# Patient Record
Sex: Female | Born: 1975 | Race: White | Hispanic: No | Marital: Married | Smoking: Never smoker
Health system: Southern US, Community
[De-identification: ages and names within clinical notes are randomized; demographics above are authoritative.]

## PROBLEM LIST (undated history)

## (undated) DIAGNOSIS — E119 Type 2 diabetes mellitus without complications: Secondary | ICD-10-CM

---

## 1999-04-22 ENCOUNTER — Other Ambulatory Visit: Admission: RE | Admit: 1999-04-22 | Discharge: 1999-04-22 | Payer: Self-pay | Admitting: Obstetrics and Gynecology

## 2000-06-13 ENCOUNTER — Other Ambulatory Visit: Admission: RE | Admit: 2000-06-13 | Discharge: 2000-06-13 | Payer: Self-pay | Admitting: Obstetrics and Gynecology

## 2001-06-26 ENCOUNTER — Other Ambulatory Visit: Admission: RE | Admit: 2001-06-26 | Discharge: 2001-06-26 | Payer: Self-pay | Admitting: Obstetrics and Gynecology

## 2002-08-06 ENCOUNTER — Other Ambulatory Visit: Admission: RE | Admit: 2002-08-06 | Discharge: 2002-08-06 | Payer: Self-pay | Admitting: Obstetrics and Gynecology

## 2002-12-25 ENCOUNTER — Other Ambulatory Visit (HOSPITAL_COMMUNITY): Admission: RE | Admit: 2002-12-25 | Discharge: 2002-12-31 | Payer: Self-pay | Admitting: Psychiatry

## 2003-03-25 ENCOUNTER — Other Ambulatory Visit: Admission: RE | Admit: 2003-03-25 | Discharge: 2003-03-25 | Payer: Self-pay | Admitting: Obstetrics and Gynecology

## 2003-08-12 ENCOUNTER — Encounter: Admission: RE | Admit: 2003-08-12 | Discharge: 2003-08-12 | Payer: Self-pay | Admitting: Obstetrics and Gynecology

## 2003-09-22 ENCOUNTER — Inpatient Hospital Stay (HOSPITAL_COMMUNITY): Admission: AD | Admit: 2003-09-22 | Discharge: 2003-09-22 | Payer: Self-pay | Admitting: Obstetrics and Gynecology

## 2003-10-11 ENCOUNTER — Inpatient Hospital Stay (HOSPITAL_COMMUNITY): Admission: RE | Admit: 2003-10-11 | Discharge: 2003-10-13 | Payer: Self-pay | Admitting: Obstetrics and Gynecology

## 2003-10-14 ENCOUNTER — Encounter: Admission: RE | Admit: 2003-10-14 | Discharge: 2003-11-13 | Payer: Self-pay | Admitting: Obstetrics and Gynecology

## 2003-11-26 ENCOUNTER — Other Ambulatory Visit: Admission: RE | Admit: 2003-11-26 | Discharge: 2003-11-26 | Payer: Self-pay | Admitting: Obstetrics and Gynecology

## 2004-12-08 ENCOUNTER — Other Ambulatory Visit: Admission: RE | Admit: 2004-12-08 | Discharge: 2004-12-08 | Payer: Self-pay | Admitting: Obstetrics and Gynecology

## 2005-11-22 ENCOUNTER — Inpatient Hospital Stay (HOSPITAL_COMMUNITY): Admission: RE | Admit: 2005-11-22 | Discharge: 2005-11-22 | Payer: Self-pay | Admitting: Obstetrics and Gynecology

## 2006-01-18 ENCOUNTER — Inpatient Hospital Stay (HOSPITAL_COMMUNITY): Admission: RE | Admit: 2006-01-18 | Discharge: 2006-01-20 | Payer: Self-pay | Admitting: Obstetrics and Gynecology

## 2006-01-21 ENCOUNTER — Encounter: Admission: RE | Admit: 2006-01-21 | Discharge: 2006-02-20 | Payer: Self-pay | Admitting: Obstetrics and Gynecology

## 2006-02-21 ENCOUNTER — Encounter: Admission: RE | Admit: 2006-02-21 | Discharge: 2006-03-21 | Payer: Self-pay | Admitting: Obstetrics and Gynecology

## 2006-03-22 ENCOUNTER — Encounter: Admission: RE | Admit: 2006-03-22 | Discharge: 2006-04-21 | Payer: Self-pay | Admitting: Obstetrics and Gynecology

## 2006-04-22 ENCOUNTER — Encounter: Admission: RE | Admit: 2006-04-22 | Discharge: 2006-05-21 | Payer: Self-pay | Admitting: Obstetrics and Gynecology

## 2006-05-22 ENCOUNTER — Encounter: Admission: RE | Admit: 2006-05-22 | Discharge: 2006-06-21 | Payer: Self-pay | Admitting: Obstetrics and Gynecology

## 2006-06-22 ENCOUNTER — Encounter: Admission: RE | Admit: 2006-06-22 | Discharge: 2006-07-20 | Payer: Self-pay | Admitting: Obstetrics and Gynecology

## 2008-08-12 ENCOUNTER — Encounter: Admission: RE | Admit: 2008-08-12 | Discharge: 2008-11-10 | Payer: Self-pay | Admitting: Obstetrics and Gynecology

## 2008-12-12 ENCOUNTER — Inpatient Hospital Stay (HOSPITAL_COMMUNITY): Admission: AD | Admit: 2008-12-12 | Discharge: 2008-12-12 | Payer: Self-pay | Admitting: Specialist

## 2008-12-12 ENCOUNTER — Ambulatory Visit: Payer: Self-pay | Admitting: Obstetrics and Gynecology

## 2008-12-19 ENCOUNTER — Observation Stay (HOSPITAL_COMMUNITY): Admission: AD | Admit: 2008-12-19 | Discharge: 2008-12-19 | Payer: Self-pay | Admitting: Obstetrics and Gynecology

## 2008-12-30 ENCOUNTER — Observation Stay (HOSPITAL_COMMUNITY): Admission: AD | Admit: 2008-12-30 | Discharge: 2008-12-31 | Payer: Self-pay | Admitting: Obstetrics and Gynecology

## 2008-12-31 ENCOUNTER — Encounter: Payer: Self-pay | Admitting: Obstetrics and Gynecology

## 2009-01-05 ENCOUNTER — Encounter (INDEPENDENT_AMBULATORY_CARE_PROVIDER_SITE_OTHER): Payer: Self-pay | Admitting: Obstetrics and Gynecology

## 2009-01-05 ENCOUNTER — Inpatient Hospital Stay (HOSPITAL_COMMUNITY): Admission: AD | Admit: 2009-01-05 | Discharge: 2009-01-08 | Payer: Self-pay | Admitting: Obstetrics and Gynecology

## 2009-01-10 ENCOUNTER — Encounter: Admission: RE | Admit: 2009-01-10 | Discharge: 2009-02-09 | Payer: Self-pay | Admitting: Obstetrics and Gynecology

## 2009-02-10 ENCOUNTER — Encounter: Admission: RE | Admit: 2009-02-10 | Discharge: 2009-03-09 | Payer: Self-pay | Admitting: Obstetrics and Gynecology

## 2009-03-10 ENCOUNTER — Encounter: Admission: RE | Admit: 2009-03-10 | Discharge: 2009-04-09 | Payer: Self-pay | Admitting: Obstetrics and Gynecology

## 2009-04-10 ENCOUNTER — Encounter: Admission: RE | Admit: 2009-04-10 | Discharge: 2009-05-10 | Payer: Self-pay | Admitting: Obstetrics and Gynecology

## 2009-05-11 ENCOUNTER — Encounter: Admission: RE | Admit: 2009-05-11 | Discharge: 2009-06-10 | Payer: Self-pay | Admitting: Obstetrics and Gynecology

## 2009-06-11 ENCOUNTER — Encounter: Admission: RE | Admit: 2009-06-11 | Discharge: 2009-07-09 | Payer: Self-pay | Admitting: Obstetrics and Gynecology

## 2009-07-10 ENCOUNTER — Encounter: Admission: RE | Admit: 2009-07-10 | Discharge: 2009-07-22 | Payer: Self-pay | Admitting: Obstetrics and Gynecology

## 2010-04-12 LAB — CBC
HCT: 32.2 % — ABNORMAL LOW (ref 36.0–46.0)
HCT: 34.1 % — ABNORMAL LOW (ref 36.0–46.0)
Hemoglobin: 11.4 g/dL — ABNORMAL LOW (ref 12.0–15.0)
MCHC: 34.6 g/dL (ref 30.0–36.0)
MCHC: 35.3 g/dL (ref 30.0–36.0)
MCV: 91.4 fL (ref 78.0–100.0)
MCV: 92.4 fL (ref 78.0–100.0)
RBC: 3.52 MIL/uL — ABNORMAL LOW (ref 3.87–5.11)
RBC: 3.69 MIL/uL — ABNORMAL LOW (ref 3.87–5.11)
WBC: 8.7 10*3/uL (ref 4.0–10.5)
WBC: 9.4 10*3/uL (ref 4.0–10.5)

## 2010-05-28 NOTE — Discharge Summary (Signed)
[name], [name] NO.:  [id number]   MEDICAL RECORD NO.:  [id number]          PATIENT TYPE:  INP   LOCATION:  9147                          FACILITY:  [facility]   PHYSICIAN:  Zelphia Cairo, MD    DATE OF BIRTH:  April 27, 1975   DATE OF ADMISSION:  01/18/2006  DATE OF DISCHARGE:  01/20/2006                               DISCHARGE SUMMARY   ADMITTING DIAGNOSES:  1. Intrauterine pregnancy at term.  2. Previous cesarean section, desires repeat.  3. Gestational diabetes, managed by Glyburide.  4. Spontaneous onset of labor.   DISCHARGE DIAGNOSIS:  Status post low transverse cesarean section to a  viable female infant.   PROCEDURE:  Repeat low transverse cesarean section.   REASON FOR ADMISSION:  Please see written H and P.   HOSPITAL COURSE:  The patient is a 35 year old gravida 2, para 1, who  was admitted to [facility] for a scheduled cesarean  section.  The patient also had started spontaneous onset of labor.  The  patient's pregnancy had been complicated by gestational diabetes which  had been managed by Glyburide.  Sugars have been under good control.  On  the morning of admission, the patient was taken to the operating room  where spinal anesthesia was administered without difficulty.  A low  transverse incision was made with delivery of a viable female infant,  weighing 6 pounds 14 ounces, Apgars at 8 at 1 minute and 9 at 5 minutes.  Arterial cord pH of 7.32.  The patient tolerated procedure and was taken  to the recovery room in a stable condition.  On postoperative day #1,  the patient was without complaint.  Vital signs were stable.  Abdomen  was soft with good return of bowel function.  Fundus was firm and  nontender.  Abdominal dressing noted to be clean, dry, and intact.  Foley had been discontinued.  The patient was voiding adequately.  Laboratory findings revealed a hemoglobin of  9.9, platelet count of  200,000,  WBC count of 9.3.   Fasting blood sugar and 2-hour  postprandial's were ordered for the following morning.  On postoperative  day #2, the patient was without complaint.  Vital signs were stable.  Capillary blood sugar was 120 that morning; however, the patient had not  fasted.  Abdomen was soft.  Fundus was firm and nontender.  Incision was  clean, dry, and intact.  The patient did desire early discharge.  Instructions were reviewed and the patient was later discharged home.   CONDITION ON DISCHARGE:  Good.   DIET:  Regular as tolerated.   ACTIVITY:  No heavy lifting, no driving x2 weeks, or vaginal entry.   FOLLOWUP:  The patient to follow up in the office in 1 week for an  incision check.  She is to call for a temperature greater than 100  degrees, persistent nausea, vomiting, heavy vaginal bleeding, and/or  redness or drainage from the incisional site.   DISCHARGE MEDICATIONS:  1. Zoloft 50 mg one p.o. daily.  2. Wellbutrin 150 mg daily.  3. Percocet 5/325 #30, 1  p.o. q. 4 to 6 hours p.r.n.  4. Motrin 600 mg every 6 hours.  5. Prenatal vitamins one p.o. daily.      Julio Sicks, N.P.      Zelphia Cairo, MD  Electronically Signed    CC/MEDQ  D:  02/01/2006  T:  02/01/2006  Job:  [id number]

## 2010-05-28 NOTE — H&P (Signed)
NAME:  Cathy [name], Cathy [name] NO.:  [id number]   MEDICAL RECORD NO.:  [id number]          PATIENT TYPE:  INP   LOCATION:  NA                            [facility]:  [facility]   PHYSICIAN:  Dineen Kid. Rana Snare, M.D.    DATE OF BIRTH:  [birth year]   DATE OF ADMISSION:  10/11/2003  DATE OF DISCHARGE:                                HISTORY & PHYSICAL   HISTORY OF PRESENT ILLNESS:  Ms. Pancake is a 35 year old, G2, P0, A1, at [redacted]  weeks gestational age, who presents for primary low __________ transverse  cesarean section due to breech presentation.  Her estimated date of  confinement is October 20, 2003.  The pregnancy has been complicated by  labile blood pressures, and also gestational diabetes, which has been  controlled with glyburide, and group B strep is negative.   PAST MEDICAL HISTORY:  1.  Significant for history of depression,  2.  Anxiety.  3.  Migraines.   PAST SURGICAL HISTORY:  TAB.   MEDICATIONS:  1.  Prenatal vitamins.  2.  Glyburide 5 mg daily.   ALLERGIES:  No known drug allergies.   PHYSICAL EXAMINATION:  VITAL SIGNS:  Blood pressure is 126/70.  HEART:  Regular rate and rhythm.  LUNGS:  Clear to auscultation bilaterally.  ABDOMEN:  Gravid, 38 cm.  PELVIC:  The cervix is fingertip, long.  Breech presentation by sonogram.   IMPRESSION:  1.  Intrauterine pregnancy at 39 weeks.  2.  Breech presentation.  3.  Gestational diabetes controlled by glyburide.   PLAN:  Primary low __________ cesarean section.  Risks and benefits were  discussed.  Informed consent was obtained.      DCL/MEDQ  D:  10/10/2003  T:  10/10/2003  Job:  [id number]

## 2010-05-28 NOTE — Op Note (Signed)
NAME:  Cathy [name], Cathy [name] NO.:  [id number]   MEDICAL RECORD NO.:  [id number]          PATIENT TYPE:  INP   LOCATION:  9147                          [facility]  [facility]   PHYSICIAN:  Dineen Kid. Rana Snare, M.D.    DATE OF BIRTH:  [birth year]   DATE OF PROCEDURE:  01/18/2006  DATE OF DISCHARGE:                               OPERATIVE REPORT   PREOPERATIVE DIAGNOSES:  1. Previous cesarean section with desire of repeat.  2. Intrauterine pregnancy at 37 weeks.  3. Gestational diabetes.  4. Labor.   POSTOPERATIVE DIAGNOSES:  1. Previous cesarean section with desire of repeat.  2. Intrauterine pregnancy at 37 weeks.  3. Gestational diabetes.  4. Labor.   PROCEDURE:  Repeat low segment transverse cesarean section.   SURGEON:  Dineen Kid. Rana Snare, M.D.   ANESTHESIA:  Spinal.   INDICATIONS FOR PROCEDURE:  Ms. Cornell is a 35 year old G3, P1, previous  cesarean section for breech, has desire of repeat cesarean section.  She  has had a pregnancy complicated by gestational diabetes requiring  glyburide for glucose control.  Underwent attempted amniocentesis  yesterday, unable to complete due to fetal position changes and also  contractions.  Patient is contracting every 5-10 minutes.  Because of  early labor, it was decided to proceed with repeat cesarean section.  Risks and benefits were discussed and informed consent was obtained.   FINDINGS:  Viable female infant, Apgars 8 and 9, pH arterial 7.32.   DESCRIPTION OF PROCEDURE:  After adequate analgesia, the patient placed  in the supine position with left lateral tilt.  She is sterilely prepped  and draped, bladder is sterilely drained with Foley catheter.  Pfannenstiel skin incision was made two fingerbreadths above the pubic  symphysis taken down sharply to the fascia which is incised  transversely, extended superiorly and inferiorly off the bellies of  rectus muscle which is separated sharply in midline.  Peritoneum entered  sharply.   Bladder flap created and placed behind the bladder blade.  Low  segment myotomy incision was made down to the infant's vertex and  extended laterally with the operator's fingertips.  Infant's vertex was  delivered atraumatically.  The nares and pharynx suctioned.  Nuchal cord  x2 was reduced.  The infant was delivered, cord clamped, cut and handed  to the pediatrician for resuscitation.  Cord blood was obtained.  Placenta extracted manually.  Uterus was exteriorized, wiped clean with  dry lap.  The __________ incision was closed in two layers, first being  running locking and the second being imbricating layer with 0 Monocryl  suture.  Uterus was placed back in the peritoneal cavity and after  copious amount of irrigation, adequate hemostasis was assured.  The  peritoneum was closed with 0 Monocryl suture.  Rectus muscle plicated in  the midline.  Irrigation was applied and after adequate hemostasis, the  fascia was closed with #1 PDS in running fashion.  Irrigation was once  again applied and after adequate hemostasis, skin staples and Steri-  Strips applied.  The patient tolerated the procedure well.  She was  stable on  transfer to the recovery room.  Sponge and instrument counts  correct x3.   ESTIMATED BLOOD LOSS:  700 mL.   Patient received 1 g of Rocephin after delivery of the placenta.      Dineen Kid Rana Snare, M.D.  Electronically Signed     DCL/MEDQ  D:  01/18/2006  T:  01/18/2006  Job:  [id number]

## 2011-03-31 IMAGING — US US FETAL BPP W/O NONSTRESS
1 series · 14 of 28 positions shown · non-contrast
Comparison: none

OBSTETRICAL ULTRASOUND:
 This ultrasound exam was performed in the [HOSPITAL] Ultrasound Department.  The OB US report was generated in the AS system, and faxed to the ordering physician.  This report is also available in [HOSPITAL]?s AccessANYware and in [REDACTED] PACS.

[Series 1: us ob comp +14 wk · 52 acquisitions, 14 frames shown]
[im 2/52]
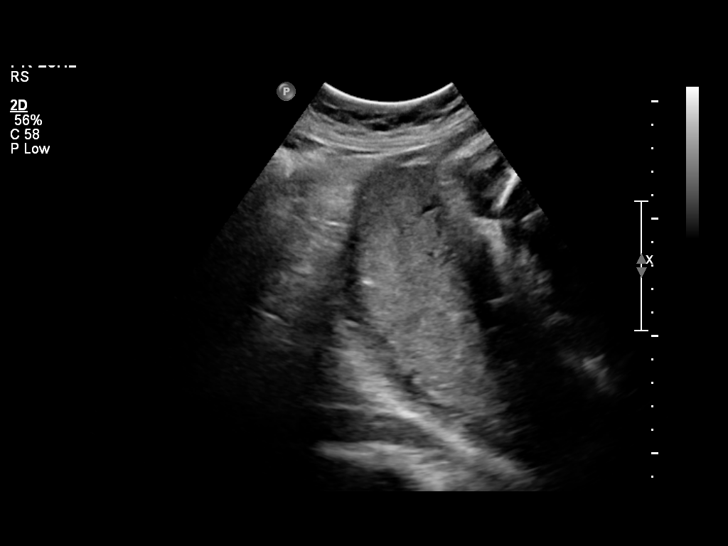
[im 6/52]
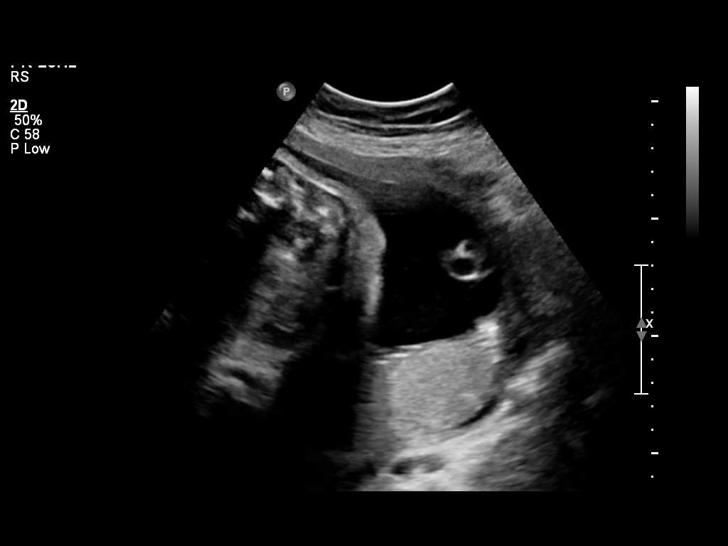
[im 10/52]
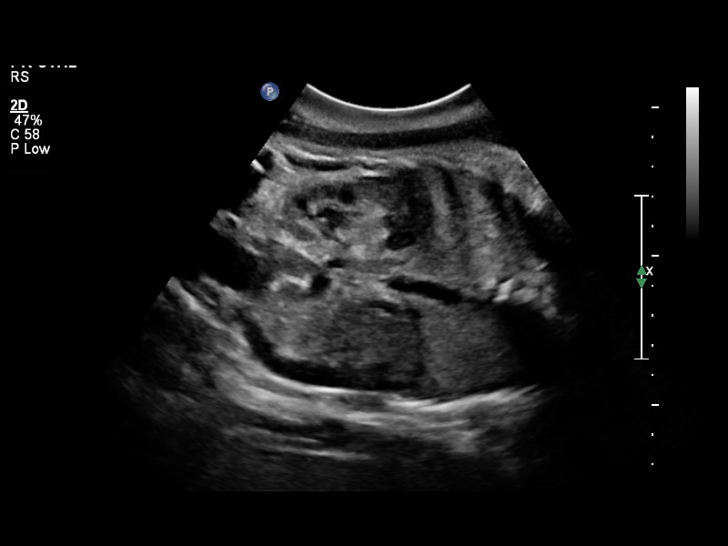
[im 14/52]
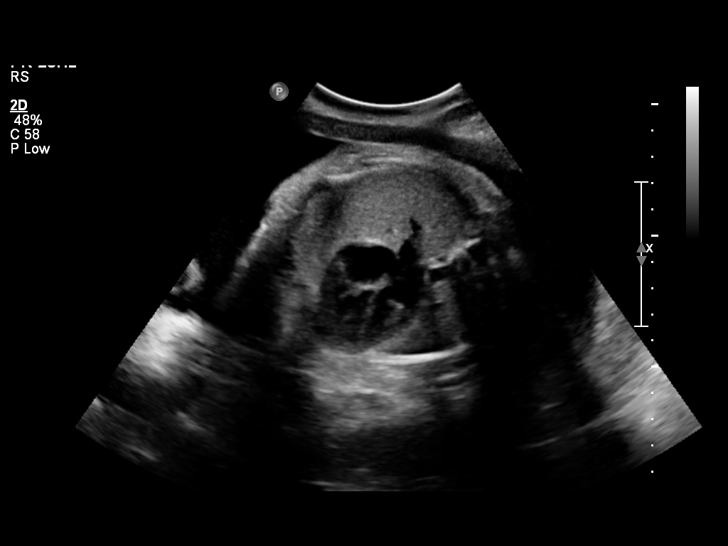
[im 18/52]
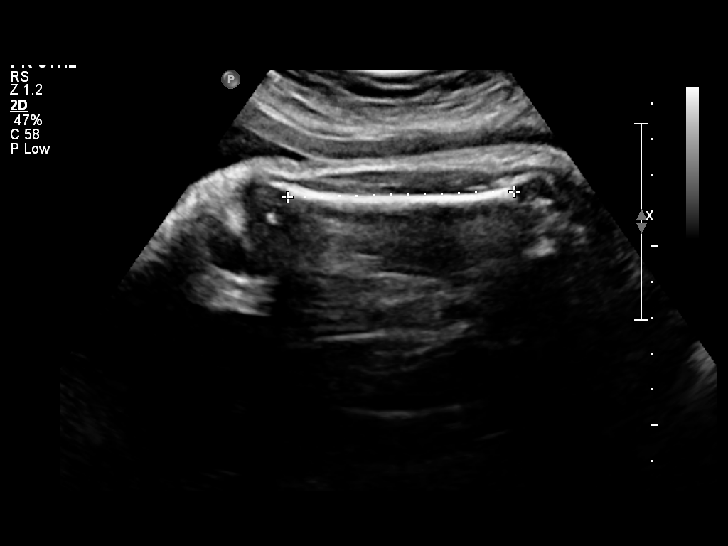
[im 21/52]
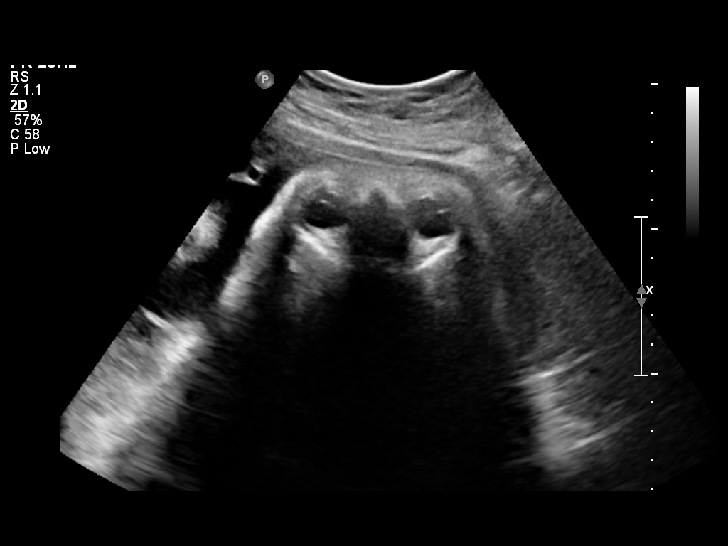
[im 25/52]
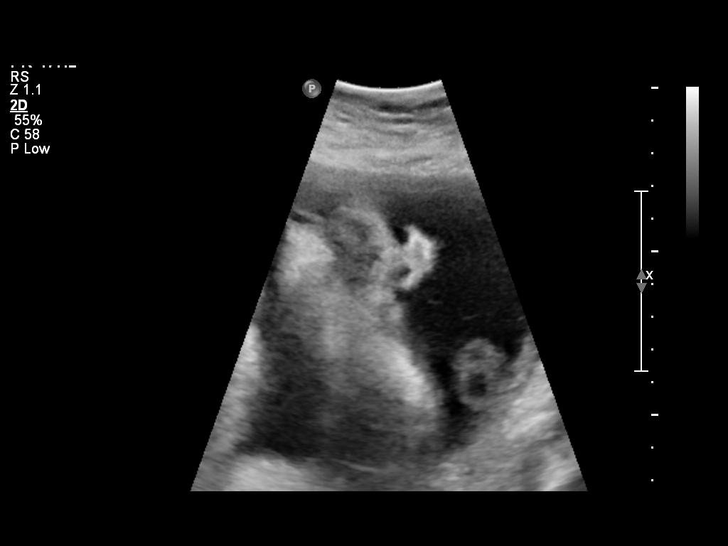
[im 29/52]
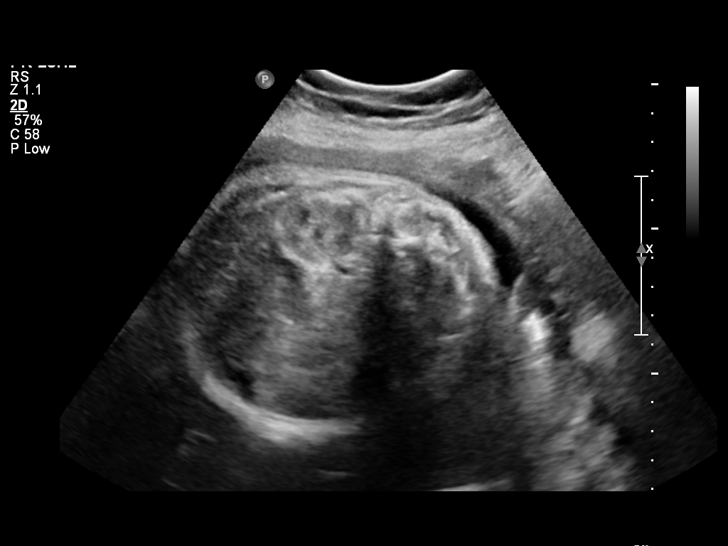
[im 33/52]
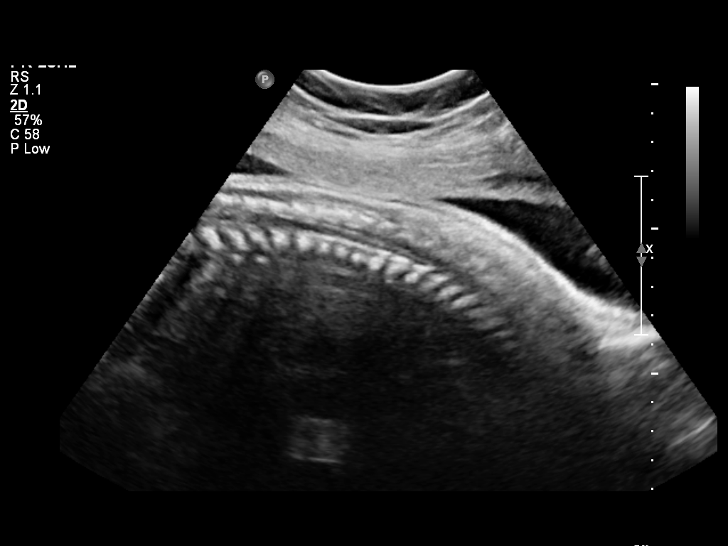
[im 36/52]
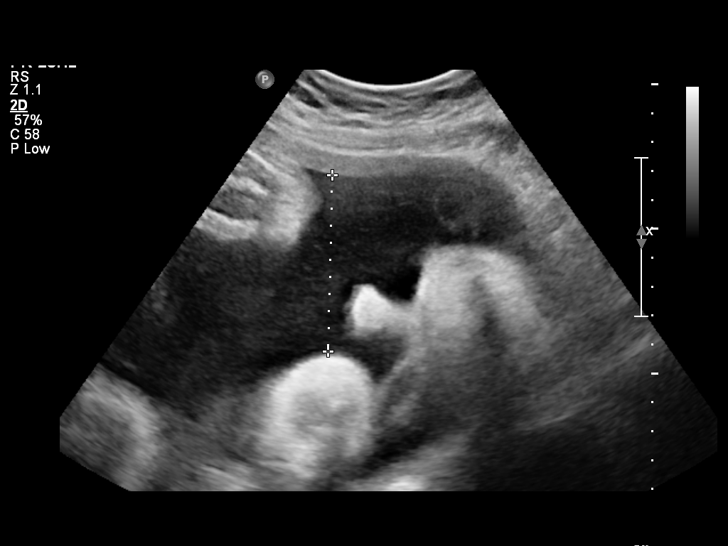
[im 40/52]
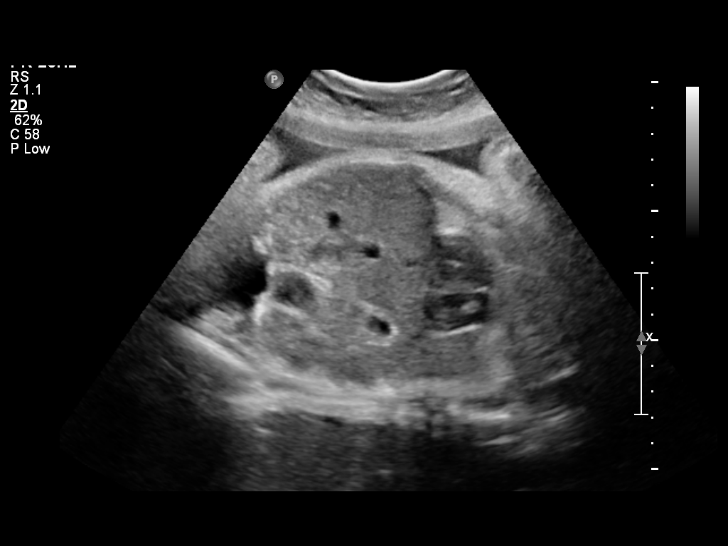
[im 44/52]
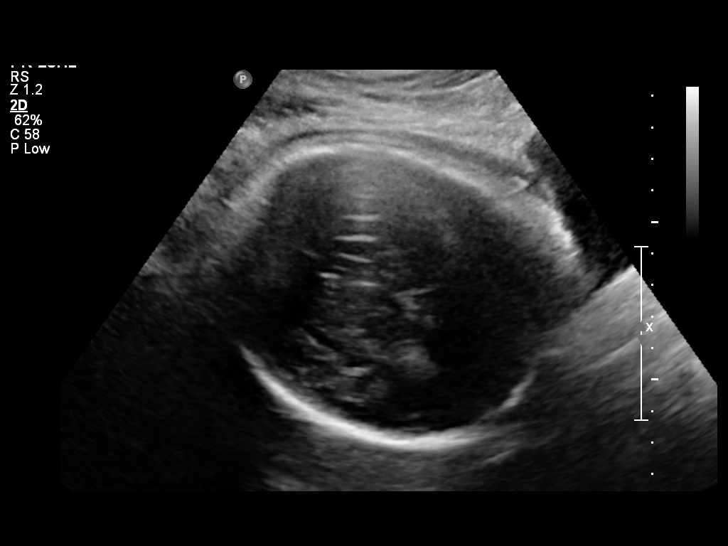
[im 48/52]
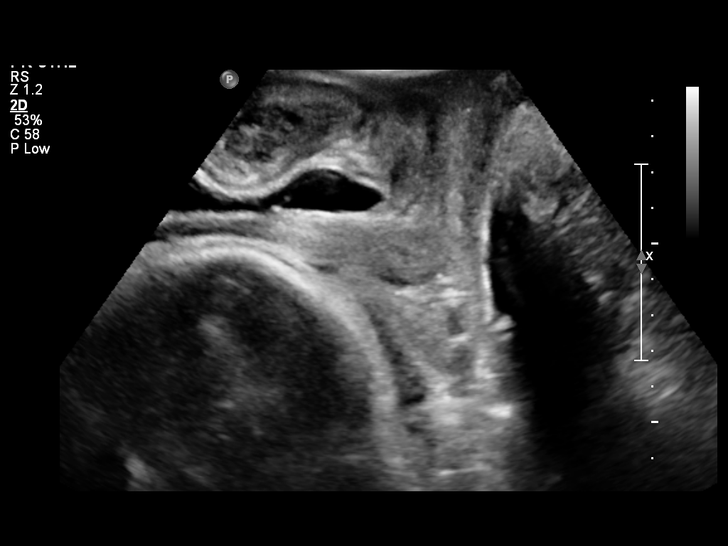
[im 52/52]
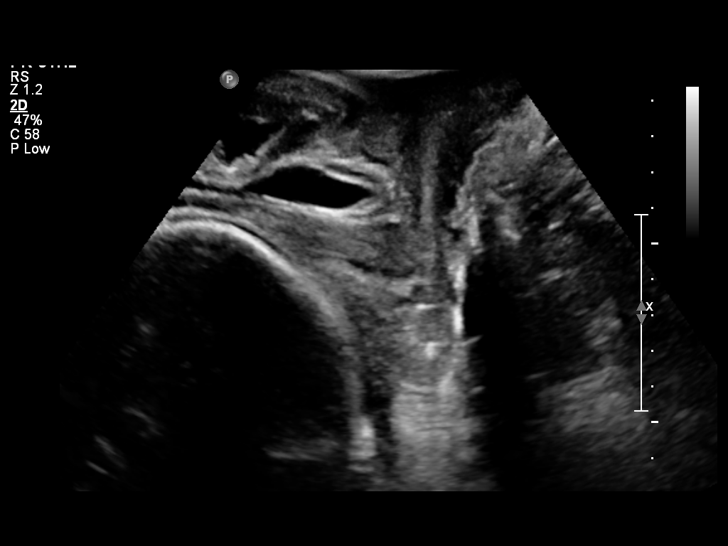

[14 of 28 positions shown; findings below may reference images not displayed]

IMPRESSION: See AS Obstetric US report.

## 2011-04-12 IMAGING — US US OB UMBILICAL ART DOPPLER
1 series · 14 of 28 positions shown · non-contrast
Comparison: none

OBSTETRICAL ULTRASOUND:
 This ultrasound was performed in The [HOSPITAL], and the AS OB/GYN report will be stored to [REDACTED] PACS.  This report is also available in [HOSPITAL]?s accessANYware.

[Series 1: us ob umbilical art doppler · 0.25mm/px · 33 acquisitions, 14 frames shown]
[im 2/33]
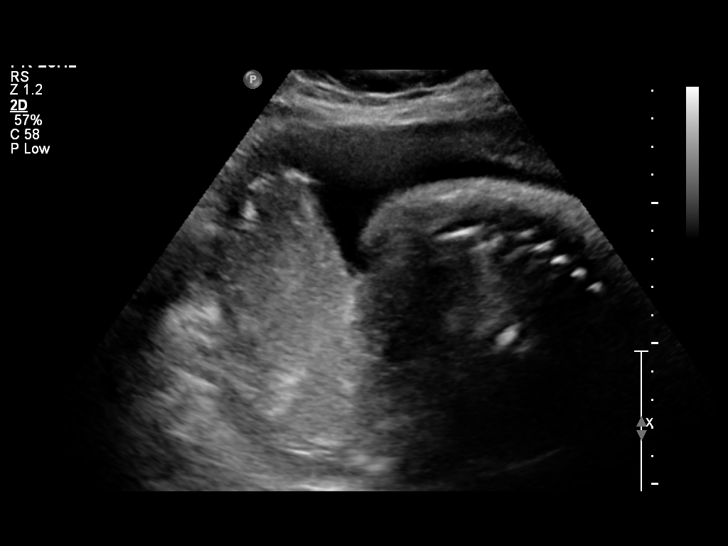
[im 4/33]
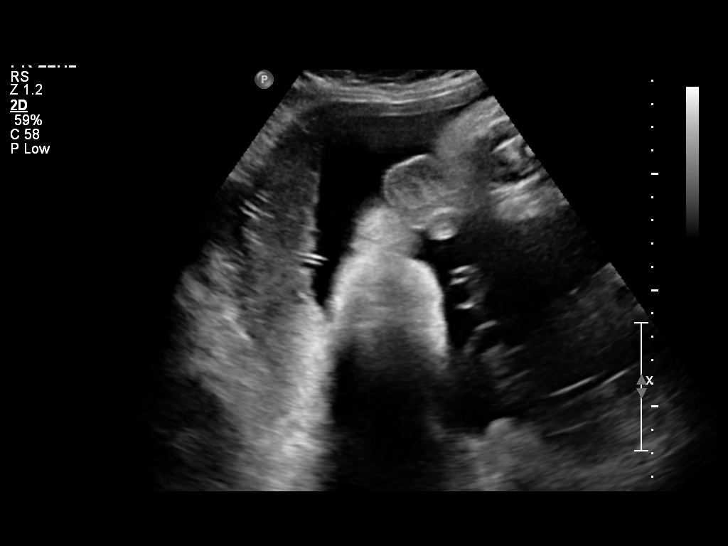
[im 6/33]
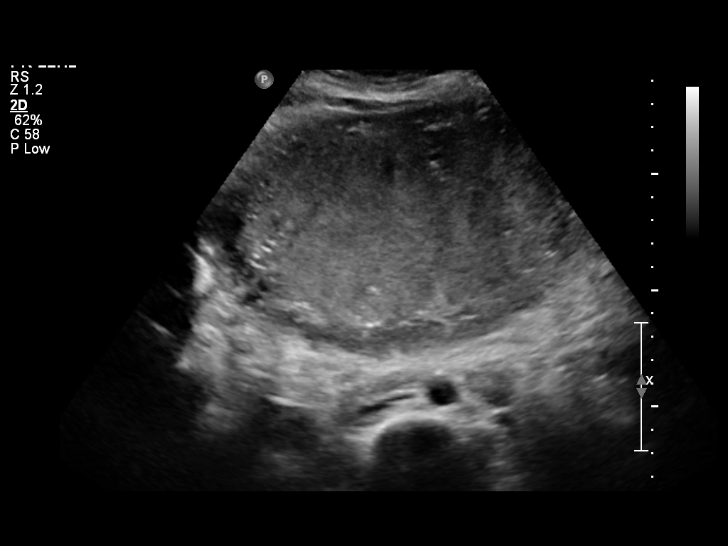
[im 9/33]
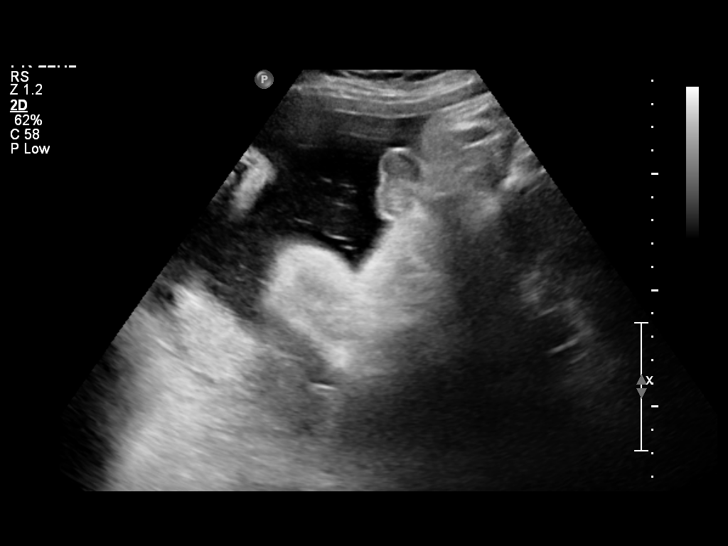
[im 11/33]
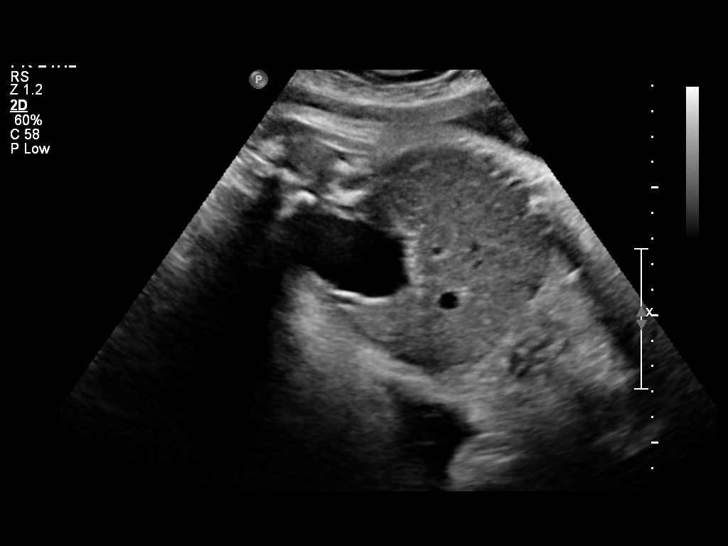
[im 14/33]
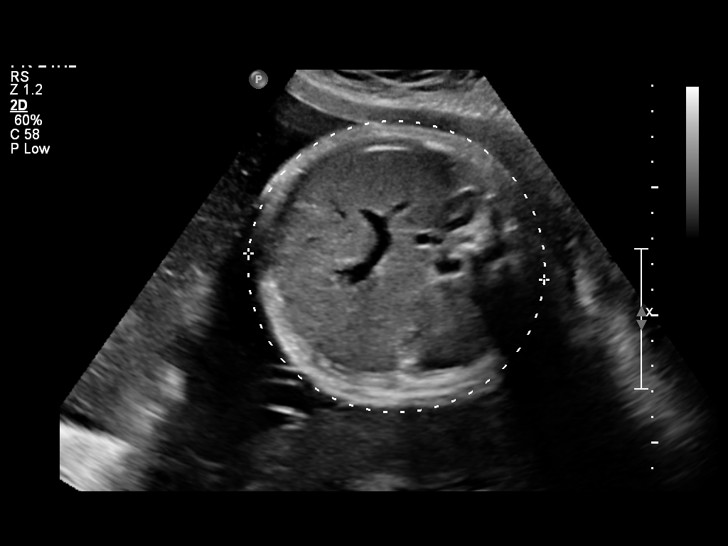
[im 16/33]
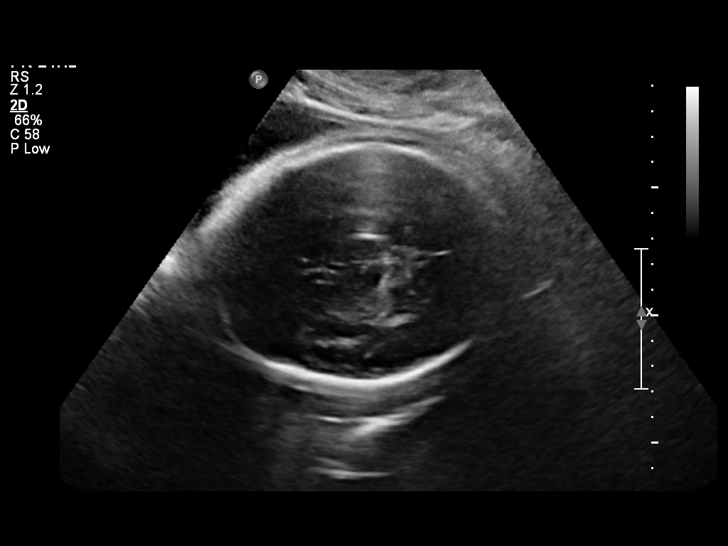
[im 18/33]
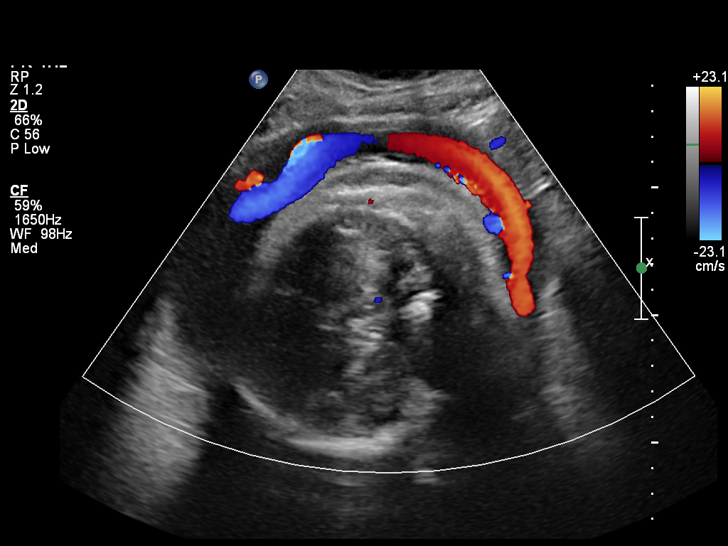
[im 21/33]
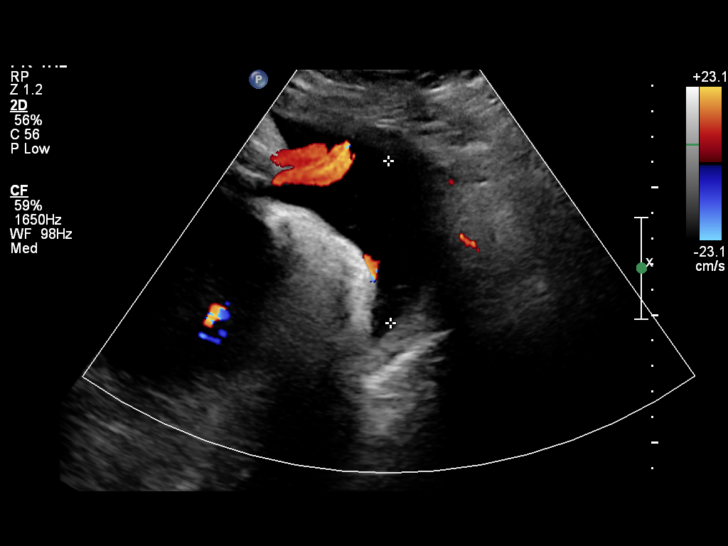
[im 23/33]
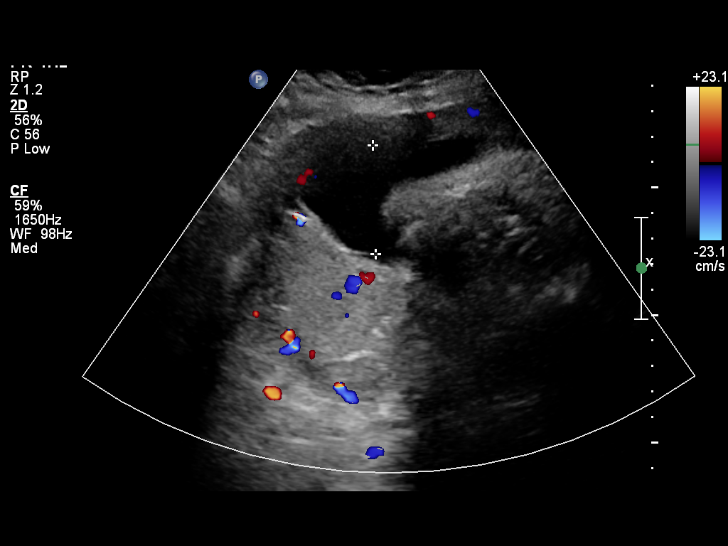
[im 25/33]
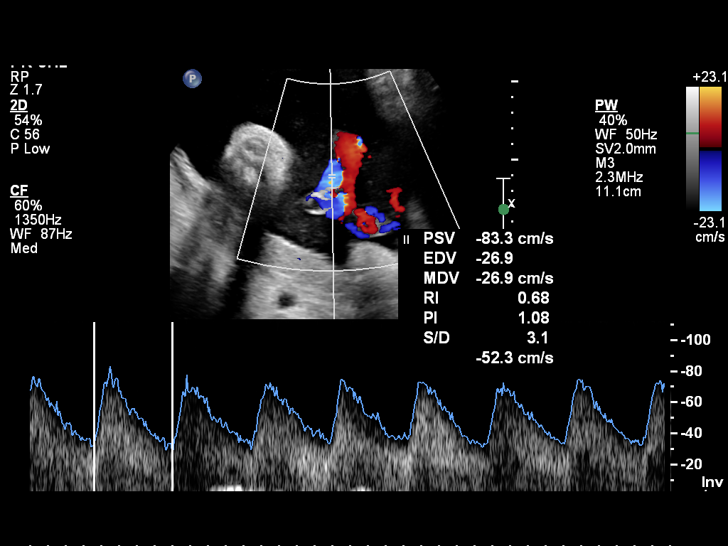
[im 28/33]
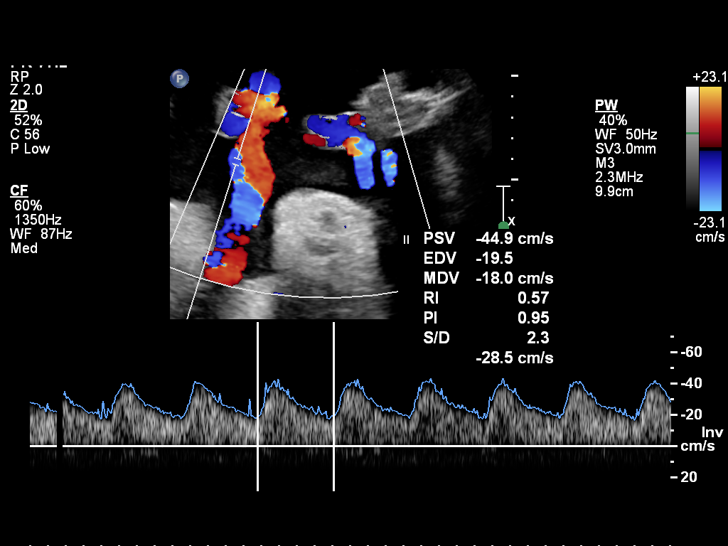
[im 30/33]
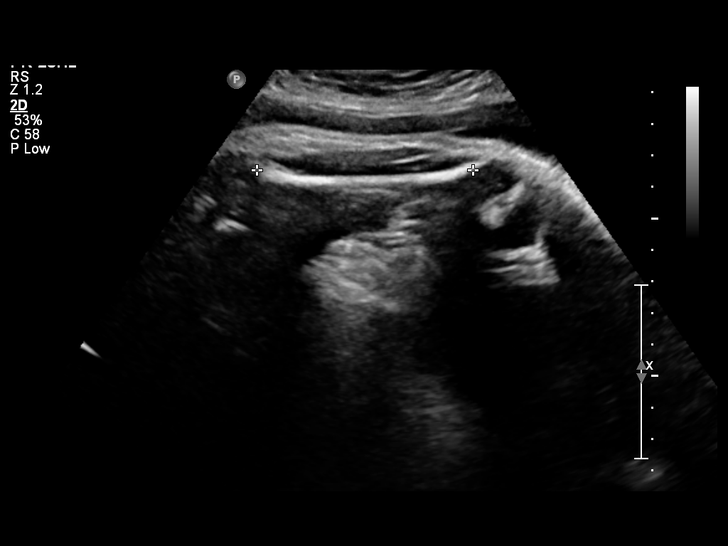
[im 33/33]
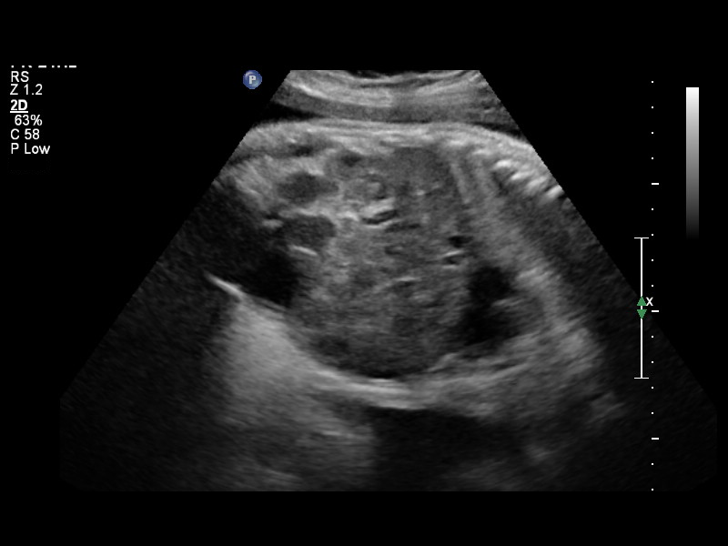

[14 of 28 positions shown; findings below may reference images not displayed]

IMPRESSION: AS OB/GYN has also been faxed to the ordering physician.

## 2014-05-22 ENCOUNTER — Other Ambulatory Visit: Payer: Self-pay | Admitting: Obstetrics and Gynecology

## 2014-05-23 LAB — CYTOLOGY - PAP

## 2019-03-16 ENCOUNTER — Ambulatory Visit: Payer: Self-pay | Attending: Internal Medicine

## 2019-03-16 DIAGNOSIS — Z23 Encounter for immunization: Secondary | ICD-10-CM | POA: Insufficient documentation

## 2019-03-16 NOTE — Progress Notes (Signed)
   Covid-19 Vaccination Clinic  Name:  Cathy [name]    MRN: [id number] DOB: 1975-12-17  03/16/2019  [name] was observed post Covid-19 immunization for 15 minutes without incident. She was provided with Vaccine Information Sheet and instruction to access the V-Safe system.   [name] was instructed to call 911 with any severe reactions post vaccine: Marland Kitchen Difficulty breathing  . Swelling of face and throat  . A fast heartbeat  . A bad rash all over body  . Dizziness and weakness   Immunizations Administered    Name Date Dose VIS Date Route   Pfizer COVID-19 Vaccine 03/16/2019  9:19 AM 0.3 mL 12/21/2018 Intramuscular   Manufacturer: [employer], [employer]   Lot: [id number]   NDC: [address]-0

## 2019-04-06 ENCOUNTER — Ambulatory Visit: Payer: Self-pay | Attending: Internal Medicine

## 2019-04-06 DIAGNOSIS — Z23 Encounter for immunization: Secondary | ICD-10-CM

## 2019-04-06 NOTE — Progress Notes (Signed)
   Covid-19 Vaccination Clinic  Name:  Cathy [name]    MRN: [id number] DOB: 05-06-1975  04/06/2019  [name] was observed post Covid-19 immunization for 15 minutes without incident. She was provided with Vaccine Information Sheet and instruction to access the V-Safe system.   [name] was instructed to call 911 with any severe reactions post vaccine: Marland Kitchen Difficulty breathing  . Swelling of face and throat  . A fast heartbeat  . A bad rash all over body  . Dizziness and weakness   Immunizations Administered    Name Date Dose VIS Date Route   Pfizer COVID-19 Vaccine 04/06/2019  8:16 AM 0.3 mL 12/21/2018 Intramuscular   Manufacturer: [employer], [employer]   Lot: [id number]   NDC: [address]-2

## 2020-08-21 ENCOUNTER — Emergency Department (HOSPITAL_BASED_OUTPATIENT_CLINIC_OR_DEPARTMENT_OTHER)
Admission: EM | Admit: 2020-08-21 | Discharge: 2020-08-21 | Disposition: A | Discharge status: Home / Self Care | Payer: BC Managed Care – PPO | Attending: Emergency Medicine | Admitting: Emergency Medicine

## 2020-08-21 ENCOUNTER — Encounter (HOSPITAL_BASED_OUTPATIENT_CLINIC_OR_DEPARTMENT_OTHER): Payer: Self-pay | Admitting: Emergency Medicine

## 2020-08-21 ENCOUNTER — Other Ambulatory Visit: Payer: Self-pay

## 2020-08-21 DIAGNOSIS — S50812A Abrasion of left forearm, initial encounter: Secondary | ICD-10-CM | POA: Diagnosis not present

## 2020-08-21 DIAGNOSIS — S61251A Open bite of left index finger without damage to nail, initial encounter: Secondary | ICD-10-CM | POA: Diagnosis not present

## 2020-08-21 DIAGNOSIS — E119 Type 2 diabetes mellitus without complications: Secondary | ICD-10-CM | POA: Diagnosis not present

## 2020-08-21 DIAGNOSIS — S50811A Abrasion of right forearm, initial encounter: Secondary | ICD-10-CM | POA: Insufficient documentation

## 2020-08-21 DIAGNOSIS — S61452A Open bite of left hand, initial encounter: Secondary | ICD-10-CM

## 2020-08-21 DIAGNOSIS — S60512A Abrasion of left hand, initial encounter: Secondary | ICD-10-CM | POA: Diagnosis not present

## 2020-08-21 DIAGNOSIS — W5501XA Bitten by cat, initial encounter: Secondary | ICD-10-CM | POA: Insufficient documentation

## 2020-08-21 DIAGNOSIS — S6991XA Unspecified injury of right wrist, hand and finger(s), initial encounter: Secondary | ICD-10-CM | POA: Diagnosis present

## 2020-08-21 DIAGNOSIS — S60511A Abrasion of right hand, initial encounter: Secondary | ICD-10-CM | POA: Insufficient documentation

## 2020-08-21 HISTORY — DX: Type 2 diabetes mellitus without complications: E11.9

## 2020-08-21 NOTE — ED Triage Notes (Addendum)
Pt arrives to ED with c/o of cat bite. Cat bite and scratches to both hands. Pt was seen at [facility] yesterday and was prescribed with Augmentin, hand a n xray, and received her Tdap.

## 2020-08-21 NOTE — ED Notes (Signed)
Patient verbalizes understanding of discharge instructions. Opportunity for questioning and answers were provided. Patient discharged from ED.  °

## 2020-08-21 NOTE — ED Provider Notes (Addendum)
[facility] EMERGENCY DEPT Provider Note   CSN: [id number] Arrival date & time: 08/21/20  7124     History Chief Complaint  Patient presents with   Animal Bite    Cathy [name] is a 45 y.o. female.  Patient yesterday morning received cat bite and scratches to hands and arms.  Patient seen at [facility] urgent care yesterday.  Started on Augmentin.  Had 2 doses yesterday.  Had an x-ray of her left hand without evidence of any foreign body or fracture.  Patient also was updated on her tetanus.  Patient here today because she is got some increased swelling to her left index finger.  She had some yesterday but its worse and some increased pain with flexion no pain with extension.  Patient's been followed by [facility] in the past.  In addition according to the urgent care note.  Mother cat is vaccinated.  But the kittens obviously have not been vaccinated yet they are too young.  Patient did not want rabies prophylaxis.  The cats can be observed.      Past Medical History:  Diagnosis Date   Diabetes mellitus without complication (HCC)     There are no problems to display for this patient.   History reviewed. No pertinent surgical history.   OB History   No obstetric history on file.     History reviewed. No pertinent family history.  Social History   Tobacco Use   Smoking status: Never   Smokeless tobacco: Never    Home Medications Prior to Admission medications   Not on File    Allergies    Morphine  Review of Systems   Review of Systems  Constitutional:  Negative for chills and fever.  HENT:  Negative for ear pain and sore throat.   Eyes:  Negative for pain and visual disturbance.  Respiratory:  Negative for cough and shortness of breath.   Cardiovascular:  Negative for chest pain and palpitations.  Gastrointestinal:  Negative for abdominal pain and vomiting.  Genitourinary:  Negative for dysuria and hematuria.  Musculoskeletal:   Positive for joint swelling. Negative for arthralgias and back pain.  Skin:  Positive for wound. Negative for color change and rash.  Neurological:  Negative for seizures and syncope.  All other systems reviewed and are negative.  Physical Exam Updated Vital Signs BP (!) 139/93 (BP Location: Left Arm)   Pulse 88   Temp 98.3 F (36.8 C) (Oral)   Resp 16   Ht 1.626 m (5\' 4" )   Wt 108.9 kg   SpO2 98%   BMI 41.20 kg/m   Physical Exam Vitals and nursing note reviewed.  Constitutional:      General: She is not in acute distress.    Appearance: Normal appearance. She is well-developed.  HENT:     Head: Normocephalic and atraumatic.  Eyes:     Conjunctiva/sclera: Conjunctivae normal.  Cardiovascular:     Rate and Rhythm: Normal rate and regular rhythm.     Heart sounds: No murmur heard. Pulmonary:     Effort: Pulmonary effort is normal. No respiratory distress.     Breath sounds: Normal breath sounds.  Abdominal:     Palpations: Abdomen is soft.     Tenderness: There is no abdominal tenderness.  Musculoskeletal:        General: Swelling and signs of injury present.     Cervical back: Normal range of motion and neck supple.     Comments: Patient with multiple  scratches to both hands forearms.  Cat bite to left index finger over the PIP joint.  Some swelling to the proximal phalange E.  Good extension reasonable flexion without significant pain.  Good cap refill distally and sensation intact.  No palmar tenderness at the base of the index finger.  Some erythema surrounding the other cat scratches.  With some minimal swelling.  Skin:    General: Skin is warm and dry.     Capillary Refill: Capillary refill takes less than 2 seconds.  Neurological:     General: No focal deficit present.     Mental Status: She is alert and oriented to person, place, and time.     Sensory: No sensory deficit.     Motor: No weakness.    ED Results / Procedures / Treatments   Labs (all labs ordered  are listed, but only abnormal results are displayed) Labs Reviewed - No data to display  EKG None  Radiology No results found.  Procedures Procedures   Medications Ordered in ED Medications - No data to display  ED Course  I have reviewed the triage vital signs and the nursing notes.  Pertinent labs & imaging results that were available during my care of the patient were reviewed by me and considered in my medical decision making (see chart for details).    MDM Rules/Calculators/A&P                           Patient status post cat bite to the left index finger.  As well as multiple scratches to both hands and and arms.  Seen in urgent care yesterday.  Tetanus updated started on Augmentin had 2 doses yesterday.  X-ray was done without any significant findings.  Patient returns today because she is got some increased swelling to the proximal part of the left index finger.  And some discomfort with flexion.  On exam no distinct evidence of tenosynovitis at this time.  However patient given precautions.  We will continue with the antibiotics.  We will recommend that she contact [facility] for follow-up because at the orthopedic group that she is used.  As it turns out patient is going out of town this weekend to [facility].  Recommend she not get her hand in the water.  And that if it does turn into a sausage or hot dog finger or significant pain with flexion or any swelling into the palm of the hand that she get seen immediately.   Final Clinical Impression(s) / ED Diagnoses Final diagnoses:  Cat bite of left hand, initial encounter    Rx / DC Orders ED Discharge Orders     None        [name], MD 08/21/20 2010    [name], MD 08/21/20 [phone]

## 2020-08-21 NOTE — Discharge Instructions (Addendum)
As we discussed continue the antibiotic.  Can take 800 mg of Motrin every 8 hours or can take 2 tablets of Aleve every 12 hours.  [place] not [place].  Get seen if the left index finger turns into a sausage or hot dog finger.  Make an appointment to follow-up with [facility].  If while on vacation the finger gets significantly worse get seen immediately.

## 2022-12-02 ENCOUNTER — Ambulatory Visit (INDEPENDENT_AMBULATORY_CARE_PROVIDER_SITE_OTHER): Payer: 59

## 2022-12-02 ENCOUNTER — Encounter: Payer: Self-pay | Admitting: Podiatry

## 2022-12-02 ENCOUNTER — Ambulatory Visit: Payer: 59 | Admitting: Podiatry

## 2022-12-02 DIAGNOSIS — M778 Other enthesopathies, not elsewhere classified: Secondary | ICD-10-CM

## 2022-12-03 NOTE — Progress Notes (Signed)
Subjective:   Patient ID: Cathy [name], female   DOB: 47 y.o.   MRN: [id number]   HPI Patient presents stating that her right big toe has started to deviate severely and she has been developing increased pain over the last 6 months.  She does not remember specific injury but has also been active losing weight currently.  Patient does not smoke and tries to be active   Review of Systems  All other systems reviewed and are negative.       Objective:  Physical Exam Vitals and nursing note reviewed.  Constitutional:      Appearance: She is well-developed.  Pulmonary:     Effort: Pulmonary effort is normal.  Musculoskeletal:        General: Normal range of motion.  Skin:    General: Skin is warm.  Neurological:     Mental Status: She is alert.     Neurovascular status found to be intact muscle strength is adequate range of motion adequate subtalar midtarsal joint.  Patient is found to have a deviated second digit in both the transverse and frontal plane with quite a bit of inflammation fluid second metatarsal phalangeal joint and redness of the digit itself.  Patient has good digital perfusion well-oriented x 3 with moderate deformity distal fourth toe that also occurred     Assessment:  Probability for flexor plate dislocation digit to right with elevation and lateral dislocation of the second metatarsal phalangeal joint and second digit     Plan:  H&P x-rays reviewed of condition and at this point I have recommended digital stabilization procedure along with shortening osteotomy and distal arthroplasty digit 4.  I did discuss this with her and she wants to do this but she has several trips and we will do this when the trips are over.  At this point cushion applied to the second digit education rendered concerning digit stabilization and patient tentatively scheduled for February and will be seen back in January to review  X-rays indicate significant dorsal lateral dislocation  digit 2 at the metatarsal phalangeal joint with distal rotational deformity digit for right

## 2023-01-20 ENCOUNTER — Telehealth: Payer: Self-pay | Admitting: Podiatry

## 2023-01-20 NOTE — Telephone Encounter (Signed)
 DOS-02/21/23  MET. OSTEOTOMY 2NS [id number] HAMMERTOE REPAIR 2,4 [id number]  [employer] EFFECTIVE DATE- 01/11/23  DEDUCTIBLE- $3300.00 WITH REMAINING $3300.00 OOP-$6650.00 WITH REMAINING $6650.00 COINSURANCE- 0%  SPOKE WITH ANA M FROM [employer] AND SHE STATED THAT PRIOR AUTH IS NOT REQUIRED FOR CPT CODE 71691 AND [phone].  CALL REF #: ANA M 01/20/23 @9 :41 AM EST

## 2023-01-26 ENCOUNTER — Ambulatory Visit: Payer: 59 | Admitting: Podiatry

## 2023-01-27 ENCOUNTER — Ambulatory Visit: Payer: 59 | Admitting: Podiatry

## 2023-02-09 ENCOUNTER — Ambulatory Visit (INDEPENDENT_AMBULATORY_CARE_PROVIDER_SITE_OTHER): Payer: BC Managed Care – PPO

## 2023-02-09 ENCOUNTER — Ambulatory Visit (INDEPENDENT_AMBULATORY_CARE_PROVIDER_SITE_OTHER): Payer: Managed Care, Other (non HMO) | Admitting: Podiatry

## 2023-02-09 ENCOUNTER — Encounter: Payer: Self-pay | Admitting: Podiatry

## 2023-02-09 DIAGNOSIS — M2041 Other hammer toe(s) (acquired), right foot: Secondary | ICD-10-CM | POA: Diagnosis not present

## 2023-02-09 DIAGNOSIS — M778 Other enthesopathies, not elsewhere classified: Secondary | ICD-10-CM | POA: Diagnosis not present

## 2023-02-09 NOTE — Progress Notes (Signed)
Subjective:   Patient ID: Cathy [name], female   DOB: 48 y.o.   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  I explained at great length there is no guarantee I can put the toe in a perfect position but I think will be significantly better and it will relieve the discomfort she is experiencing.  I allowed her to read consent form going over line by line alternative treatments complications reviewed that total recovery takes approximately 6 months and she will be in surgical shoe boot for about 6 weeks.  I did dispense air fracture walker that I want her to get used to at this time and its fitted properly to her lower leg and she will wear this during the postoperative.  X-rays indicate there is significant elevation rigid contracture digit to right pressure against the MPJ distal deformity fourth digit right

## 2023-02-09 NOTE — Patient Instructions (Signed)

## 2023-02-20 MED ORDER — HYDROCODONE-ACETAMINOPHEN 10-325 MG PO TABS
1.0000 | ORAL_TABLET | Freq: Three times a day (TID) | ORAL | 0 refills | Status: AC | PRN
Start: 2023-02-20 — End: 2023-02-25

## 2023-02-21 DIAGNOSIS — M2041 Other hammer toe(s) (acquired), right foot: Secondary | ICD-10-CM | POA: Diagnosis not present

## 2023-02-21 DIAGNOSIS — M21541 Acquired clubfoot, right foot: Secondary | ICD-10-CM | POA: Diagnosis not present

## 2023-02-27 ENCOUNTER — Encounter: Payer: Self-pay | Admitting: Podiatry

## 2023-02-27 ENCOUNTER — Ambulatory Visit (INDEPENDENT_AMBULATORY_CARE_PROVIDER_SITE_OTHER): Payer: Managed Care, Other (non HMO) | Admitting: Podiatry

## 2023-02-27 ENCOUNTER — Ambulatory Visit (INDEPENDENT_AMBULATORY_CARE_PROVIDER_SITE_OTHER): Payer: Managed Care, Other (non HMO)

## 2023-02-27 VITALS — Ht 64.0 in | Wt 240.0 lb

## 2023-02-27 DIAGNOSIS — M21541 Acquired clubfoot, right foot: Secondary | ICD-10-CM | POA: Diagnosis not present

## 2023-02-27 DIAGNOSIS — Z9889 Other specified postprocedural states: Secondary | ICD-10-CM

## 2023-02-27 NOTE — Progress Notes (Signed)
 Subjective:   Patient ID: Cathy [name], female   DOB: 48 y.o.   MRN: [id number]   HPI Patient states doing very well very pleased so far   ROS      Objective:  Physical Exam  Neuro vascular status intact negative Denna Haggard' sign noted digits 2 in much better alignment compared to preoperatively with incision sites healing well no drainage noted stitches intact     Assessment:  Doing well post fusion metatarsal osteotomy arthroplasty     Plan:  H&P x-rays reviewed and sterile dressing reapplied.  Continue elevation compression reappoint to recheck  X-rays indicate pins intact osteotomy healing well screw intact

## 2023-03-13 ENCOUNTER — Ambulatory Visit (INDEPENDENT_AMBULATORY_CARE_PROVIDER_SITE_OTHER)

## 2023-03-13 ENCOUNTER — Ambulatory Visit (INDEPENDENT_AMBULATORY_CARE_PROVIDER_SITE_OTHER): Admitting: Podiatry

## 2023-03-13 ENCOUNTER — Encounter: Payer: 59 | Admitting: Podiatry

## 2023-03-13 ENCOUNTER — Encounter: Payer: Self-pay | Admitting: Podiatry

## 2023-03-13 VITALS — Ht 64.0 in | Wt 240.0 lb

## 2023-03-13 DIAGNOSIS — Z9889 Other specified postprocedural states: Secondary | ICD-10-CM

## 2023-03-14 NOTE — Progress Notes (Signed)
 Subjective:   Patient ID: Cathy [name], female   DOB: 48 y.o.   MRN: [id number]   HPI Patient presents doing very well pleased so far and pleased with position of second toe neurovasc   ROS      Objective:  Physical Exam  There are status intact negative Denna Haggard' sign noted wound edges coapted well stitches intact pin intact good range of motion     Assessment:  Doing well post digital fusion shortening osteotomy right hammertoe repair fourth right     Plan:  H&P reviewed condition and at this point I took x-rays.  I recommended continued immobilization elevation compression will be seen back to recheck  X-rays indicate there is a good alignment of the second digit with pin

## 2023-03-16 ENCOUNTER — Encounter: Admitting: Podiatry

## 2023-03-27 ENCOUNTER — Ambulatory Visit (INDEPENDENT_AMBULATORY_CARE_PROVIDER_SITE_OTHER)

## 2023-03-27 DIAGNOSIS — M2041 Other hammer toe(s) (acquired), right foot: Secondary | ICD-10-CM

## 2023-03-27 NOTE — Progress Notes (Unsigned)
 POV #3, DOS 02/21/23, METATARSAL OSTEOTOMY 2ND RT, HAMMER TOE REPAIR 2,4 RT. No signs of infection noted to incision site. Pt denies pain. Minimal swelling noted. Instructions on compression anklet given. Pt to follow up in 3 weeks.

## 2023-03-28 NOTE — Progress Notes (Signed)
 Subjective:   Patient ID: Cathy [name], female   DOB: 49 y.o.   MRN: [id number]   HPI Above note   ROS      Objective:  Physical Exam  Negative Denna Haggard' sign noted good digital perfusion pin in plac     Assessment:  Overall doing very well with surgery     Plan:  Pin removed tolerated well patient may gradually return to soft shoe gear and range of motion exercises reappoint in the next 6 weeks with sterile dressing applied second toe  X-rays do indicate good alignment fixation in place joint open currently with the possibility the toe may have somewhat slipped dorsally but I did not see any indication of this clinically it appears to be bending well

## 2023-04-24 ENCOUNTER — Ambulatory Visit (INDEPENDENT_AMBULATORY_CARE_PROVIDER_SITE_OTHER)

## 2023-04-24 ENCOUNTER — Encounter: Payer: Self-pay | Admitting: Podiatry

## 2023-04-24 ENCOUNTER — Ambulatory Visit (INDEPENDENT_AMBULATORY_CARE_PROVIDER_SITE_OTHER): Admitting: Podiatry

## 2023-04-24 DIAGNOSIS — M2041 Other hammer toe(s) (acquired), right foot: Secondary | ICD-10-CM | POA: Diagnosis not present

## 2023-04-24 DIAGNOSIS — Z9889 Other specified postprocedural states: Secondary | ICD-10-CM

## 2023-04-24 NOTE — Progress Notes (Signed)
 Subjective:   Patient ID: Cathy [name], female   DOB: 48 y.o.   MRN: [id number]   HPI Patient presents stating overall she is doing very well but does get some forefoot discomfort and swelling if she is on her feet for extended periods of time   ROS      Objective:  Physical Exam  Neurovascular status intact with inflammation of the right forefoot localized that clinically the foot looks excellent positional component looks great     Assessment:  Doing very well surgically having mild discomfort but overall much better     Plan:  Reviewed x-rays and patient at this point can resume normal activities is discharged and I explained may have some low to moderate grade swelling for another couple months and should have complete resolution at that time.  Reappoint as needed  X-rays indicate there is some elevation of the second digit right with compression of the joint surface but overall it is improved and should overall solve her pathological pain she was experience
